# Patient Record
Sex: Male | Born: 1980 | Race: White | Hispanic: No | Marital: Married | Smoking: Former smoker
Health system: Southern US, Community
[De-identification: ages and names within clinical notes are randomized; demographics above are authoritative.]

## PROBLEM LIST (undated history)

## (undated) DIAGNOSIS — I10 Essential (primary) hypertension: Secondary | ICD-10-CM

## (undated) DIAGNOSIS — J45909 Unspecified asthma, uncomplicated: Secondary | ICD-10-CM

## (undated) HISTORY — PX: BACK SURGERY: SHX140

---

## 2017-03-21 ENCOUNTER — Encounter: Payer: Self-pay | Admitting: Emergency Medicine

## 2017-03-21 ENCOUNTER — Ambulatory Visit
Admission: EM | Admit: 2017-03-21 | Discharge: 2017-03-21 | Disposition: A | Discharge status: Home / Self Care | Payer: Managed Care, Other (non HMO) | Attending: Emergency Medicine | Admitting: Emergency Medicine

## 2017-03-21 DIAGNOSIS — H10021 Other mucopurulent conjunctivitis, right eye: Secondary | ICD-10-CM | POA: Diagnosis not present

## 2017-03-21 HISTORY — DX: Unspecified asthma, uncomplicated: J45.909

## 2017-03-21 MED ORDER — FLUORESCEIN SODIUM 0.6 MG OP STRP: 2.0000 | ORAL_STRIP | Freq: Once | OPHTHALMIC | Status: DC

## 2017-03-21 MED ORDER — TETRACAINE HCL 0.5 % OP SOLN: 1.0000 [drp] | Freq: Once | OPHTHALMIC | Status: DC

## 2017-03-21 MED ORDER — MOXIFLOXACIN HCL 0.5 % OP SOLN: 1.0000 [drp] | Freq: Three times a day (TID) | OPHTHALMIC | 0 refills | Status: AC

## 2017-03-21 NOTE — ED Triage Notes (Signed)
Patient c/o redness, tenderness, and drainage from both eyes that started yesterday.

## 2017-03-21 NOTE — ED Provider Notes (Addendum)
[facility]    CSN: [id number] Arrival date & time: 03/21/17  1511     History   Chief Complaint Chief Complaint  Patient presents with  . Eye Problem    HPI Justin Arellano is a 36 y.o. male.   HPI  This a 36 year old male who presents with redness and drainage with matting of his right eye. His had no pain no photo sensitivity. He does not wear contacts. Has had several episodes of bacterial Conjunctivitis as a child and also in adulthood. He does not have any foreign body sensation. He denies any injury to his eye      Past Medical History:  Diagnosis Date  . Asthma     There are no active problems to display for this patient.   Past Surgical History:  Procedure Laterality Date  . BACK SURGERY         Home Medications    Prior to Admission medications   Medication Sig Start Date End Date Taking? Authorizing Provider  albuterol (PROVENTIL HFA;VENTOLIN HFA) 108 (90 Base) MCG/ACT inhaler Inhale 2 puffs into the lungs every 6 (six) hours as needed for wheezing or shortness of breath.   Yes [provider]  moxifloxacin (VIGAMOX) 0.5 % ophthalmic solution Place 1 drop into the right eye 3 (three) times daily. 03/21/17   Lutricia Feil, PA-C    Family History Family History  Problem Relation Age of Onset  . Hypertension Mother   . Hypertension Father     Social History Social History  Substance Use Topics  . Smoking status: Current Every Day Smoker    Types: Cigarettes  . Smokeless tobacco: Never Used  . Alcohol use Yes     Allergies   Penicillins   Review of Systems Review of Systems  Constitutional: Negative for activity change, appetite change, chills, fatigue and fever.  Eyes: Positive for discharge, redness and itching. Negative for photophobia, pain and visual disturbance.  All other systems reviewed and are negative.    Physical Exam Triage Vital Signs ED Triage Vitals [03/21/17 1656]  Enc Vitals Group   BP (!) 148/100     Pulse Rate 68     Resp 16     Temp 98.3 F (36.8 C)     Temp Source Oral     SpO2 100 %     Weight 280 lb (127 kg)     Height  (1.803 m)     Head Circumference      Peak Flow      Pain Score 2     Pain Loc      Pain Edu?      Excl. in GC?    No data found.   Updated Vital Signs BP (!) 157/99 (BP Location: Left Arm)   Pulse 68   Temp 98.3 F (36.8 C) (Oral)   Resp 16   Ht  (1.803 m)   Wt 280 lb (127 kg)   SpO2 100%   BMI 39.05 kg/m   Visual Acuity Right Eye Distance: 20/20 corrected Left Eye Distance: 20/25 corrected Bilateral Distance: 20/25 corrected  Right Eye Near:   Left Eye Near:    Bilateral Near:     Physical Exam  Constitutional: He is oriented to person, place, and time. He appears well-developed and well-nourished. No distress.  HENT:  Head: Normocephalic.  Eyes: Pupils are equal, round, and reactive to light. EOM are normal. Right eye exhibits discharge. Left eye exhibits no discharge.  Induration and has purulent dried drainage from his eye with matting present. The conjunctiva is erythematous. No foreign bodies are seen. Fluorescein was not performed.  Neck: Normal range of motion. Neck supple.  Musculoskeletal: Normal range of motion.  Neurological: He is alert and oriented to person, place, and time.  Skin: Skin is warm and dry. He is not diaphoretic.  Psychiatric: He has a normal mood and affect. His behavior is normal. Judgment and thought content normal.  Nursing note and vitals reviewed.    UC Treatments / Results  Labs (all labs ordered are listed, but only abnormal results are displayed) Labs Reviewed - No data to display  EKG  EKG Interpretation None       Radiology No results found.  Procedures Procedures (including critical care time)  Medications Ordered in UC Medications  tetracaine (PONTOCAINE) 0.5 % ophthalmic solution 1-2 drop (not administered)  fluorescein ophthalmic strip 2 strip  (not administered)     Initial Impression / Assessment and Plan / UC Course  I have reviewed the triage vital signs and the nursing notes.  Pertinent labs & imaging results that were available during my care of the patient were reviewed by me and considered in my medical decision making (see chart for details).     Plan: 1. Test/x-ray results and diagnosis reviewed with patient 2. rx as per orders; risks, benefits, potential side effects reviewed with patient 3. Recommend supportive treatment with cool compresses as necessary for comfort. Use caution around your children so as not to contaminate them. If not improving in 2 days follow-up with  eye-phone number and address were provided to you. 4. F/u prn if symptoms worsen or don't improve   Final Clinical Impressions(s) / UC Diagnoses   Final diagnoses:  Other mucopurulent conjunctivitis of right eye    New Prescriptions Discharge Medication List as of 03/21/2017  5:29 PM    START taking these medications   Details  moxifloxacin (VIGAMOX) 0.5 % ophthalmic solution Place 1 drop into the right eye 3 (three) times daily., Starting Thu 03/21/2017, Normal         Controlled Substance Prescriptions [place] Controlled Substance Registry consulted? Not Applicable   Lutricia FeilRoemer, William P, PA-C 03/21/17 1736    Lutricia Feiloemer, William P, PA-C 03/21/17 1737

## 2019-09-16 ENCOUNTER — Other Ambulatory Visit: Payer: Self-pay | Admitting: Gerontology

## 2019-09-16 DIAGNOSIS — N183 Chronic kidney disease, stage 3 unspecified: Secondary | ICD-10-CM

## 2019-09-24 ENCOUNTER — Ambulatory Visit
Admission: RE | Admit: 2019-09-24 | Discharge: 2019-09-24 | Disposition: A | Discharge status: Home / Self Care | Payer: 59 | Source: Ambulatory Visit | Attending: Gerontology | Admitting: Gerontology

## 2019-09-24 ENCOUNTER — Other Ambulatory Visit: Payer: Self-pay

## 2019-09-24 DIAGNOSIS — N183 Chronic kidney disease, stage 3 unspecified: Secondary | ICD-10-CM | POA: Insufficient documentation

## 2019-10-12 ENCOUNTER — Other Ambulatory Visit: Payer: Self-pay | Admitting: Physical Medicine and Rehabilitation

## 2019-10-12 DIAGNOSIS — M5416 Radiculopathy, lumbar region: Secondary | ICD-10-CM

## 2019-10-18 ENCOUNTER — Ambulatory Visit
Admission: RE | Admit: 2019-10-18 | Discharge: 2019-10-18 | Disposition: A | Discharge status: Home / Self Care | Payer: 59 | Source: Ambulatory Visit | Attending: Physical Medicine and Rehabilitation | Admitting: Physical Medicine and Rehabilitation

## 2019-10-18 DIAGNOSIS — M5416 Radiculopathy, lumbar region: Secondary | ICD-10-CM | POA: Diagnosis not present

## 2021-08-30 ENCOUNTER — Other Ambulatory Visit: Payer: Self-pay | Admitting: Orthopedic Surgery

## 2021-08-30 DIAGNOSIS — S8392XA Sprain of unspecified site of left knee, initial encounter: Secondary | ICD-10-CM

## 2021-09-01 ENCOUNTER — Ambulatory Visit
Admission: RE | Admit: 2021-09-01 | Discharge: 2021-09-01 | Disposition: A | Discharge status: Home / Self Care | Payer: No Typology Code available for payment source | Source: Ambulatory Visit | Attending: Orthopedic Surgery | Admitting: Orthopedic Surgery

## 2021-09-01 DIAGNOSIS — S8392XA Sprain of unspecified site of left knee, initial encounter: Secondary | ICD-10-CM

## 2021-09-21 ENCOUNTER — Other Ambulatory Visit: Payer: Self-pay | Admitting: Orthopedic Surgery

## 2021-09-21 NOTE — Progress Notes (Signed)
Bilateral standing hip to ankle x-rays on one cassette for evaluation of alignment 

## 2022-05-05 IMAGING — MR MR KNEE*L* W/O CM
6 series · 40 of 40 positions shown · non-contrast
Comparison: None.

CLINICAL DATA: Left knee pain and swelling since a fall 3 weeks
ago.

EXAM:
MRI OF THE LEFT KNEE WITHOUT CONTRAST
TECHNIQUE: Multiplanar, multisequence MR imaging of the knee was performed. No
intravenous contrast was administered.

[Series 3: T2 fat-sat · axial · left · 4.0mm · 0.59mm/px · z∈[-60,+80]mm · 7 of 33 slices shown (1 of 3)]
[im 1/33]
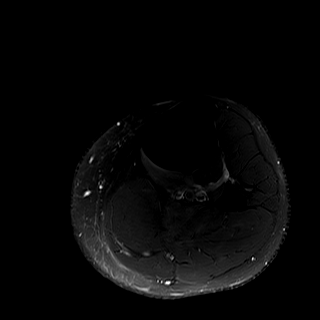
[im 6/33]
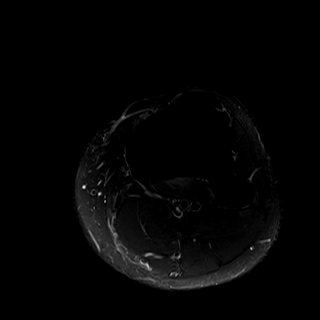
[im 11/33]
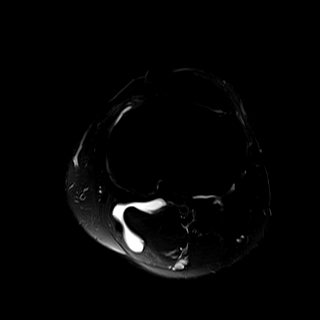
[im 17/33]
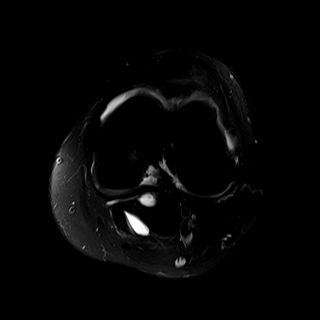
[im 22/33]
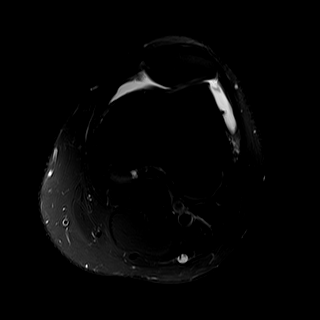
[im 27/33]
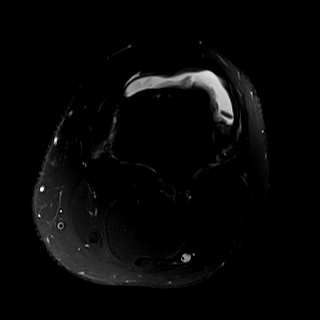
[im 33/33]
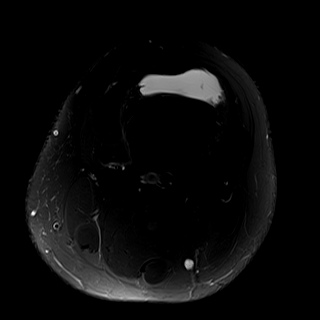

[Series 4: T2 fat-sat · coronal · left · 4.0mm · 0.56mm/px · 6 of 28 slices shown (2 of 3)]
[im 1/28]
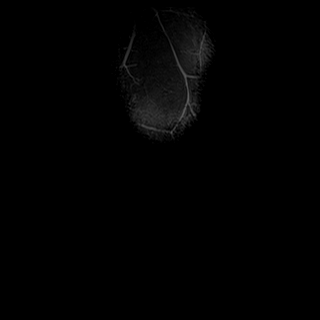
[im 6/28]
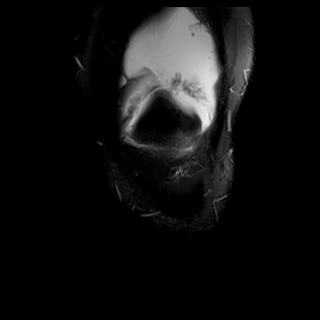
[im 11/28]
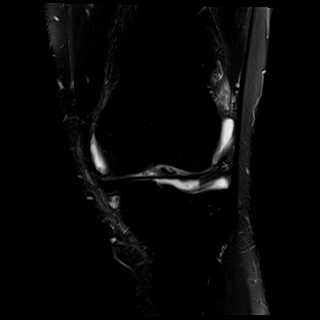
[im 17/28]
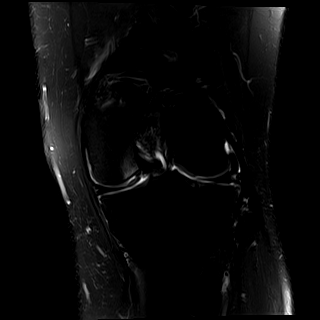
[im 22/28]
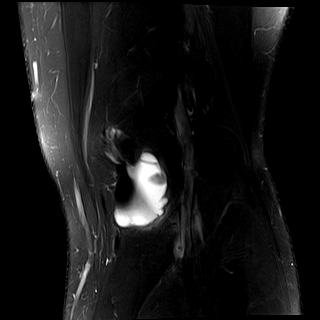
[im 28/28]
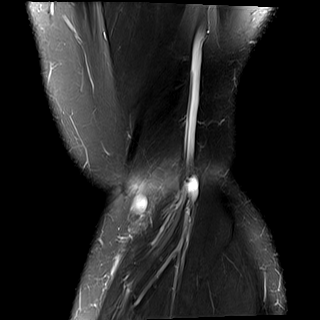

[Series 5: T1 · coronal · left · 4.0mm · 0.56mm/px · 6 of 30 slices shown]
[im 1/30]
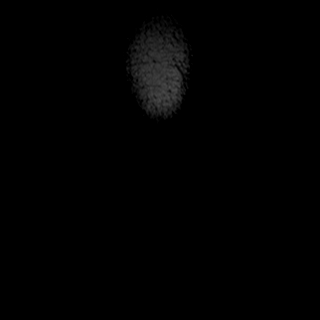
[im 6/30]
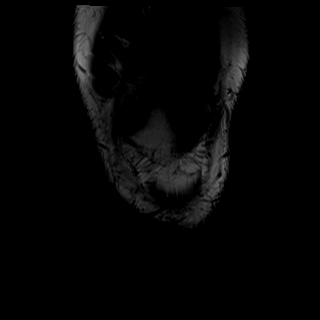
[im 12/30]
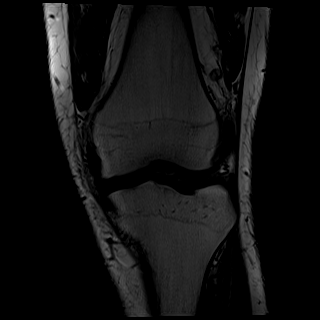
[im 18/30]
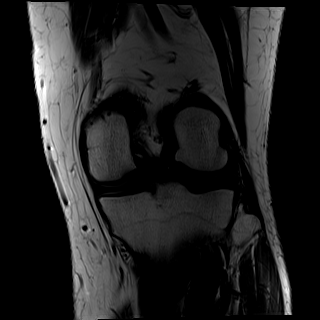
[im 24/30]
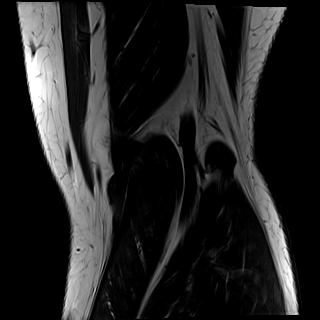
[im 30/30]
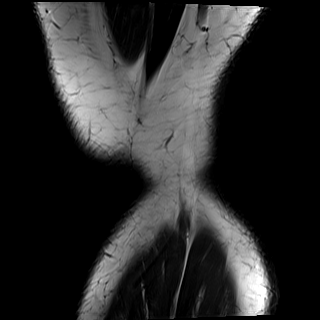

[Series 6: PD fat-sat · coronal · left · 3.0mm · 0.70mm/px · 7 of 34 slices shown (1 of 2)]
[im 1/34]
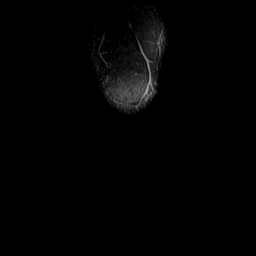
[im 6/34]
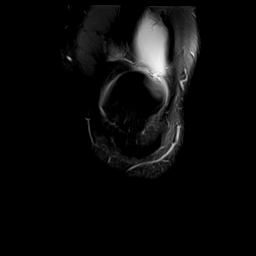
[im 12/34]
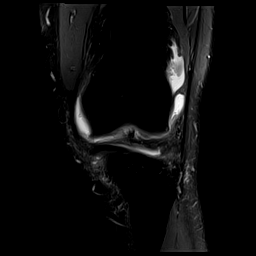
[im 17/34]
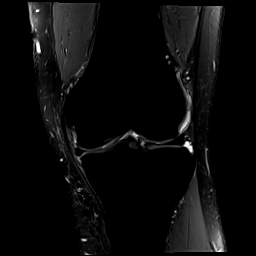
[im 23/34]
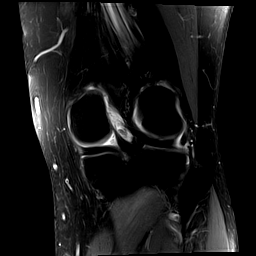
[im 28/34]
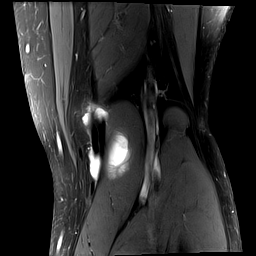
[im 34/34]
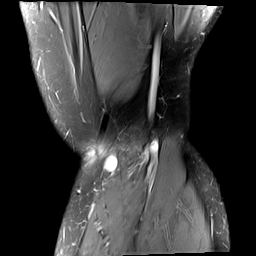

[Series 7: PD fat-sat · sagittal · left · 3.0mm · 0.56mm/px · 7 of 32 slices shown (2 of 2)]
[im 1/32]
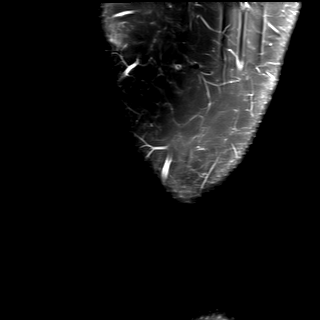
[im 6/32]
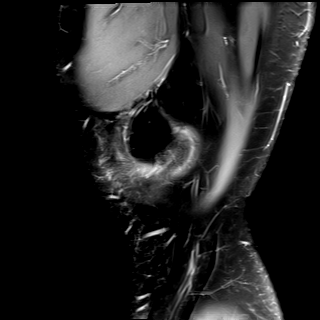
[im 11/32]
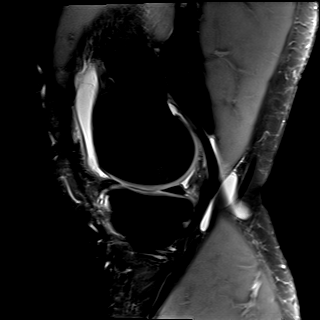
[im 16/32]
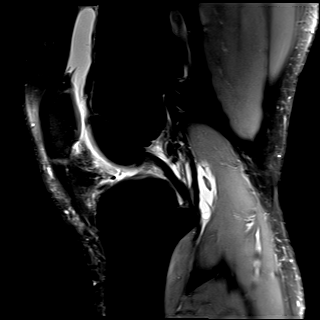
[im 21/32]
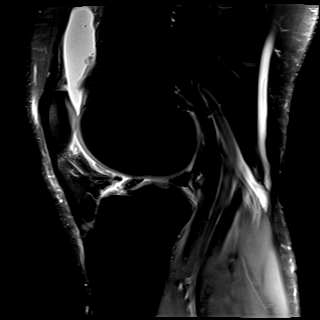
[im 26/32]
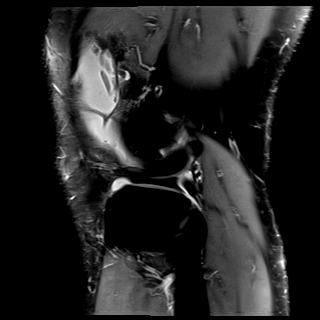
[im 32/32]
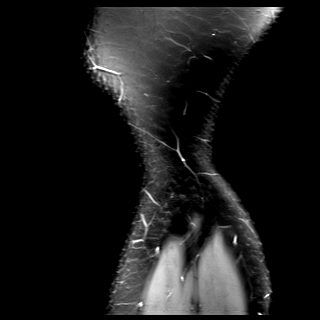

[Series 8: T2 fat-sat · sagittal · left · 3.0mm · 0.56mm/px · 7 of 32 slices shown (3 of 3)]
[im 1/32]
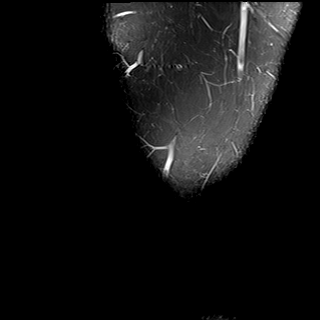
[im 6/32]
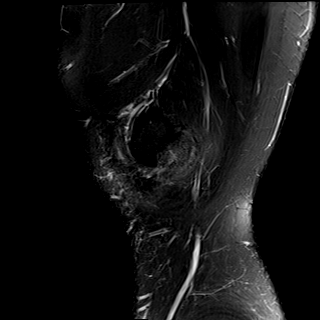
[im 11/32]
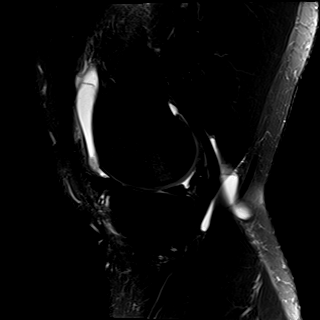
[im 16/32]
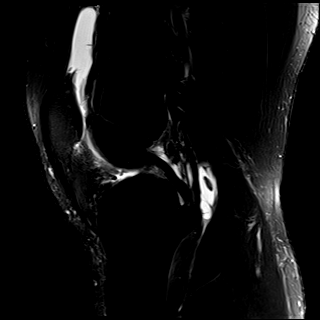
[im 21/32]
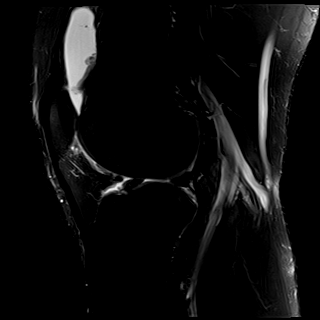
[im 26/32]
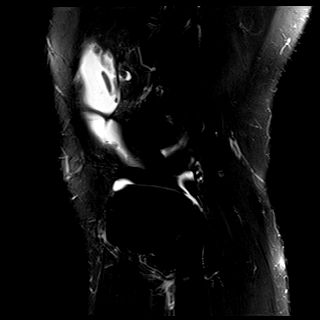
[im 32/32]
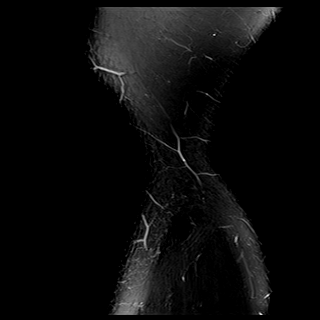

[40 of 40 positions shown; findings below may reference images not displayed]

FINDINGS: MENISCI

Medial meniscus:  Intact.

Lateral meniscus:  Intact.

LIGAMENTS

Cruciates:  Intact.

Collaterals:  Intact.

CARTILAGE

Patellofemoral: Mild cartilage thinning is seen along the inferior
aspect of the lateral femoral trochlea.

Medial: The patient has an osteochondral defect along the posterior
aspect of the medial femoral condyle measuring approximately 0.6 cm
transverse by 0.8 cm AP. Fluid undermines hyaline cartilage and
there is subchondral edema in underlying bone. A linear focus of
intermediate decreased T2 signal measuring 1 cm craniocaudal by
cm transverse is compatible with a loose hyaline cartilage fragment
Baker's cyst.

Lateral:  Mildly degenerated.

Joint: Moderate joint effusion. Along the lateral femoral trochlea,
a focus of intermediate decreased T2 signal measuring 1.4 cm
craniocaudal by 0.8 cm transverse is likely a hyaline cartilage
fragment.

Popliteal Fossa: The patient has a Baker's cyst which cannot be
accurately measured due to its shape but is approximately 3 cm
transverse by up to 3 cm AP by 4.5 cm craniocaudal.

Extensor Mechanism:  Intact.

Bones:  As above.  Otherwise negative.

Other: None.
IMPRESSION: Osteochondral lesion posterior aspect of the medial femoral condyle
with fluid undermining hyaline cartilage worrisome for fragment
instability.

Mild appearing osteoarthritis. Linear foci most compatible with
loose cartilage fragments are identified in a Baker cyst and along
the lateral femoral trochlea.

Negative for meniscal or ligament tear.

## 2022-10-16 ENCOUNTER — Encounter: Payer: Self-pay | Admitting: Emergency Medicine

## 2022-10-16 ENCOUNTER — Ambulatory Visit
Admission: EM | Admit: 2022-10-16 | Discharge: 2022-10-16 | Disposition: A | Discharge status: Home / Self Care | Payer: Commercial Managed Care - PPO | Attending: Physician Assistant | Admitting: Physician Assistant

## 2022-10-16 DIAGNOSIS — J069 Acute upper respiratory infection, unspecified: Secondary | ICD-10-CM

## 2022-10-16 DIAGNOSIS — Z20822 Contact with and (suspected) exposure to covid-19: Secondary | ICD-10-CM

## 2022-10-16 HISTORY — DX: Essential (primary) hypertension: I10

## 2022-10-16 LAB — SARS CORONAVIRUS 2 BY RT PCR: SARS Coronavirus 2 by RT PCR: NEGATIVE

## 2022-10-16 MED ORDER — PROMETHAZINE-DM 6.25-15 MG/5ML PO SYRP: 5.0000 mL | ORAL_SOLUTION | Freq: Four times a day (QID) | ORAL | 0 refills | Status: AC | PRN

## 2022-10-16 NOTE — ED Triage Notes (Signed)
Pt c/o cough, body aches, nasal congestion, runny nose. Started this morning. Wife tested positive for covid yesterday.

## 2022-10-16 NOTE — ED Provider Notes (Signed)
[facility]    CSN: [id number] Arrival date & time: 10/16/22  1036      History   Chief Complaint Chief Complaint  Patient presents with   Cough    HPI Justin Arellano is a 42 y.o. male.   Patient is a 42 year old male who presents with complaint of cough, body aches, nasal congestion, and runny nose that started this morning.  His wife tested positive for COVID yesterday.  His son is in with similar symptoms.  Patient reports his wife symptoms started after being at a meeting at local hospital last week.  Patient states he has had COVID previously without any major complications.  He did not take antivirals at that time.  He does treated symptoms and did his quarantine.  Patient states he has necessary refills on his albuterol inhaler.      Past Medical History:  Diagnosis Date   Asthma    Hypertension     There are no problems to display for this patient.   Past Surgical History:  Procedure Laterality Date   BACK SURGERY         Home Medications    Prior to Admission medications   Medication Sig Start Date End Date Taking? Authorizing Provider  promethazine-dextromethorphan (PROMETHAZINE-DM) 6.25-15 MG/5ML syrup Take 5 mLs by mouth 4 (four) times daily as needed for cough. 10/16/22  Yes Candis Schatz, PA-C  albuterol (PROVENTIL HFA;VENTOLIN HFA) 108 (90 Base) MCG/ACT inhaler Inhale 2 puffs into the lungs every 6 (six) hours as needed for wheezing or shortness of breath.    [provider]  moxifloxacin (VIGAMOX) 0.5 % ophthalmic solution Place 1 drop into the right eye 3 (three) times daily. 03/21/17   Lutricia Feil, PA-C    Family History Family History  Problem Relation Age of Onset   Hypertension Mother    Hypertension Father     Social History Social History   Tobacco Use   Smoking status: Former    Types: Cigarettes   Smokeless tobacco: Never  Vaping Use   Vaping Use: Never used  Substance Use Topics   Alcohol use:  Yes   Drug use: Never     Allergies   Penicillins   Review of Systems Review of Systems as noted above in HPI.  Other systems reviewed and found to be negative   Physical Exam Triage Vital Signs ED Triage Vitals  Enc Vitals Group     BP 10/16/22 1132 (!) 166/110     Pulse Rate 10/16/22 1132 77     Resp 10/16/22 1132 18     Temp 10/16/22 1132 98.5 F (36.9 C)     Temp Source 10/16/22 1132 Oral     SpO2 10/16/22 1132 98 %     Weight 10/16/22 1131 279 lb 15.8 oz (127 kg)     Height 10/16/22 1131 5\' 11"  (1.803 m)     Head Circumference --      Peak Flow --      Pain Score 10/16/22 1131 5     Pain Loc --      Pain Edu? --      Excl. in GC? --    No data found.  Updated Vital Signs BP (!) 171/109 (BP Location: Left Arm)   Pulse 77   Temp 98.5 F (36.9 C) (Oral)   Resp 18   Ht 5\' 11"  (1.803 m)   Wt 279 lb 15.8 oz (127 kg)   SpO2 98%  BMI 39.05 kg/m   Visual Acuity Right Eye Distance:   Left Eye Distance:   Bilateral Distance:    Right Eye Near:   Left Eye Near:    Bilateral Near:     Physical Exam Constitutional:      Appearance: Normal appearance.  HENT:     Nose: Nose normal.  Cardiovascular:     Rate and Rhythm: Normal rate and regular rhythm.     Heart sounds: Normal heart sounds. No murmur heard. Pulmonary:     Effort: Pulmonary effort is normal. No respiratory distress.     Breath sounds: Normal breath sounds. No wheezing or rhonchi.  Neurological:     General: No focal deficit present.     Mental Status: He is alert and oriented to person, place, and time.      UC Treatments / Results  Labs (all labs ordered are listed, but only abnormal results are displayed) Labs Reviewed  SARS CORONAVIRUS 2 BY RT PCR    EKG   Radiology No results found.  Procedures Procedures (including critical care time)  Medications Ordered in UC Medications - No data to display  Initial Impression / Assessment and Plan / UC Course  I have reviewed  the triage vital signs and the nursing notes.  Pertinent labs & imaging results that were available during my care of the patient were reviewed by me and considered in my medical decision making (see chart for details).    Patient presents with upper respiratory viral symptoms that started this morning.  Patient has a history of asthma.  Patient wife tested positive for COVID yesterday but has not been feeling well since the meeting last week.  Son is also presenting with similar symptoms.  Patient has had COVID in the past without any lingering complications.  He did not require any antiviral medications during his last illness.  Give him COVID isolation precautions as well as ER precautions for worsening respiratory status. Final Clinical Impressions(s) / UC Diagnoses   Final diagnoses:  Viral URI with cough  Close exposure to COVID-19 virus     Discharge Instructions      -COVID swab is pending.  Results will be available on MyChart. -Isolate at home for 5 days from start of symptoms and with a full 24 hours of being fever free without ibuprofen or Tylenol -Can use over-the-counter medications for symptom management -Ibuprofen Tylenol as needed for pain -Promethazine cough syrup 4 times a day as needed for cough -Should symptoms worsen, including worsening shortness of breath, report to the ER for further evaluation and management     ED Prescriptions     Medication Sig Dispense Auth. Provider   promethazine-dextromethorphan (PROMETHAZINE-DM) 6.25-15 MG/5ML syrup Take 5 mLs by mouth 4 (four) times daily as needed for cough. 118 mL Candis Schatz, PA-C      PDMP not reviewed this encounter.   Candis Schatz, PA-C 10/16/22 1153

## 2022-10-16 NOTE — Discharge Instructions (Addendum)
-  COVID swab is pending.  Results will be available on MyChart. -Isolate at home for 5 days from start of symptoms and with a full 24 hours of being fever free without ibuprofen or Tylenol -Can use over-the-counter medications for symptom management -Ibuprofen Tylenol as needed for pain -Promethazine cough syrup 4 times a day as needed for cough -Should symptoms worsen, including worsening shortness of breath, report to the ER for further evaluation and management

## 2023-10-19 DIAGNOSIS — Z419 Encounter for procedure for purposes other than remedying health state, unspecified: Secondary | ICD-10-CM | POA: Diagnosis not present

## 2023-11-18 DIAGNOSIS — Z419 Encounter for procedure for purposes other than remedying health state, unspecified: Secondary | ICD-10-CM | POA: Diagnosis not present

## 2023-12-19 DIAGNOSIS — Z419 Encounter for procedure for purposes other than remedying health state, unspecified: Secondary | ICD-10-CM | POA: Diagnosis not present

## 2024-01-18 DIAGNOSIS — Z419 Encounter for procedure for purposes other than remedying health state, unspecified: Secondary | ICD-10-CM | POA: Diagnosis not present

## 2024-02-18 DIAGNOSIS — Z419 Encounter for procedure for purposes other than remedying health state, unspecified: Secondary | ICD-10-CM | POA: Diagnosis not present

## 2024-03-20 DIAGNOSIS — Z419 Encounter for procedure for purposes other than remedying health state, unspecified: Secondary | ICD-10-CM | POA: Diagnosis not present

## 2024-04-21 NOTE — Progress Notes (Signed)
 Subjective:  CC: Chief Complaint  Patient presents with  . Hypertension       Patient ID: Justin Arellano is a 43 y.o. male. This an established patient is here today for an Acute Problem Office Visit.  HPI: Pt is here today out of concern for his BP. He was previously taken off his BP meds due to stability. So, he had not been checking BP at home. Wife took his B/P. Was 160/90 two weeks ago. Wife noticed anytime he got stressed, his face got very red. He's sweating more than normal. Yesterday am, pt started having sinus pain and pressure, frontal sinus congestion. He has been taking Dayquil and Nyquil, likely causing worsening BP elevation. Today, pt has been having a severe headache, blurred vision, generally feeling awful. No chest pain or shortness of breath. BP severely elevated when first coming in to the office. After a few minutes, BP rechecked, had come down quite a bit. Strongly encouraged pt to go to the ED. Pt declined going to the ED. Advised plan of how to bring BP down. (Rx for Clonidine sent to the pharmacy. Pt to take one immediately, then repeat dose in 1-2 hours. Recheck BP in 2 hours. If still elevated above 140/90, give another dose.) Wife is a Radiation Protection Practitioner. She will keep a close eye on pt. She and Pt are not going in to work tomorrow in order to monitor pt. Advised pt/wife low threshold for having pt go to the ED with ANY change in status/ worsening of BP. Wife will call in the morning with updates on Bps.    Review of Systems: Review of Systems  Constitutional:  Negative for chills and fever.  HENT:  Positive for sinus pressure and sinus pain. Negative for ear pain and sore throat.   Eyes:  Positive for visual disturbance. Negative for pain.  Respiratory:  Negative for cough and shortness of breath.   Cardiovascular:  Negative for chest pain and palpitations.  Gastrointestinal:  Negative for abdominal pain and vomiting.  Genitourinary:  Negative for dysuria and hematuria.   Musculoskeletal:  Negative for arthralgias and back pain.  Skin:  Negative for color change and rash.  Neurological:  Positive for light-headedness and headaches. Negative for seizures and syncope.  All other systems reviewed and are negative.   Allergies: Penicillins  Past Medical/Surgical History: Past Medical History:  Diagnosis Date  . Allergy [birth year]   Penicillin  . CKD (chronic kidney disease) stage 3, GFR 30-59 ml/min (CMS-HCC) 05/21/2017   Cr 1.4 with GFR 58 on 05/17/17  . Depression   . Hyperlipidemia, mixed 05/21/2017  . Hypertension, uncontrolled 09/16/2019  . Obesity (BMI 35.0-39.9 without comorbidity), unspecified   . Tobacco use    Past Surgical History:  Procedure Laterality Date  . ENDOSCOPIC LUMBAR DISCECTOMY W/ LASER  2015   2 discectomy @ [facility]   . Upper Dental removal   10/14/2020   TOP TEETH REMOVED  . SPINE SURGERY  12/2013   2 discectomy  . VASECTOMY  02/2018    Social History: Social History   Socioeconomic History  . Marital status: Married  . Number of children: 2  Tobacco Use  . Smoking status: Former    Current packs/day: 0.00    Average packs/day: 1 pack/day for 17.0 years (17.0 ttl pk-yrs)    Types: Cigarettes    Start date: 03/06/2005    Quit date: 03/06/2022    Years since quitting: 2.1    Passive exposure: Never  . Smokeless tobacco:  Former    Types: Snuff  . Tobacco comments:    1-2 cigarettes daily  Vaping Use  . Vaping status: Former  Substance and Sexual Activity  . 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 Drug use: Not Currently    Comment: used to smoke marijuana   . 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 Difficulty of Paying Living Expenses: Not very hard  Food Insecurity: No Food Insecurity (09/24/2019)   Hunger Vital Sign   . Worried About Programme Researcher, Broadcasting/film/video in the Last Year: Never true   . Ran Out of Food in the Last Year: Never true  Transportation Needs: No Transportation Needs (09/24/2019)   PRAPARE - Transportation   . Lack of Transportation (Medical): No   . Lack of Transportation (Non-Medical): No  Social Connections: Unknown (09/24/2019)   Social Connection and Isolation Panel   . Frequency of Communication with Friends and Family: Twice a week   . Frequency of Social Gatherings with Friends and Family: Twice a week   . Attends Religious Services: Not asked   . Marital Status: Married  Housing Stability: Unknown (12/15/2023)   Housing Stability Vital Sign   . Homeless in the Last Year: No    Family History: Family History  Problem Relation Name Age of Onset  . Irritable bowel syndrome Mother Nena   . Anesthesia problems Mother Nena   . Stroke Father Georganna   . Depression Father Georganna   . Alcohol abuse Father Georganna   . Breast cancer Maternal Grandmother Hargis   . Skin cancer Maternal Grandfather Jerrell Marina   . No Known Problems Paternal Grandmother    . No Known Problems Paternal Grandfather      Immunizations: Immunization History  Administered Date(s) Administered  . COVID-19 Moderna Vaccine (1st,2nd,3rd dose = 0.53ml) 01/03/2020, 02/01/2020  . Influenza IIV4, IM PF (6 mo+) (FLULAVAL/FLUZONE/FLUARIX QUAD) 05/17/2017  . PNEUMOCOCCAL (PPSV23)(>=30YRS -OR- >=2 YRS WITH RISK) VACCINE (PNEUMOVAX 23) 05/17/2017  . TDAP (>=49YR) VACCINE (ADACEL/BOOSTRIX) 07/26/2017    Depression Screening: Depression assessment: Patient underlying factors that can contribute to depression: none  PHQ 2/9 last 3 flowsheet values    04/23/2022     1:27 PM 10/24/2022   10:17 AM 04/21/2024    4:12 PM  PHQ-2/9 Depression Screening   Little interest or pleasure in doing things  0 3  Feeling down, depressed, or hopeless  0 3  Patient Health Questionnaire-2 Score  0 * 6  Trouble falling or staying asleep, or sleeping too much   3  Feeling tired or having little energy   3  Poor appetite or overeating   0  Feeling bad about yourself - or that you are a failure or have let yourself or your family down   3  Trouble concentrating on things, such as reading the newspaper or watching television   0  Moving or speaking so slowly that other people could have noticed? Or the opposite -  being so fidgety or restless that you have been moving around a lot more than usual.   1  Thoughts that you would be better off dead or hurting yourself in some way   0  Patient Health Questionnaire-9 Score   16  (OBSOLETE) Little interest or pleasure in doing things 0    (OBSOLETE) Feeling down, depressed, or hopeless (or irritable for Teens only)? 0    (OBSOLETE) Total Prescreening Score 0    (OBSOLETE) Total Score = 0      * Data saved with a previous flowsheet row definition      Is the depression screen above positive? (Score >9) No, the score was 0-4  Time Spent: 0-4 min      Goals:  Goals     . Eat more fruits and vegetables     Patient would like to eat a more healthy diet.         Current Medications: No outpatient medications have been marked as taking for the 04/21/24 encounter (Office Visit) with Steva Clotilda Conn, NP.    Medication list above reconciled and reviewed for current and on-going appropriateness using patient's verbal report of what he is taking.      Objective:   Vitals:   04/21/24 1653  BP: (!) 190/110  Pulse:    Ht:177.8 cm (5' 10) Wt:(!) 132.7 kg (292 lb 9.6 oz) AFP:Anib mass index is 41.98 kg/m.   Physical Exam Vitals and nursing note reviewed.  Constitutional:      General: He is not in acute  distress.    Appearance: Normal appearance. He is well-developed. He is not diaphoretic.  HENT:     Head: Normocephalic.  Eyes:     Pupils: Pupils are equal, round, and reactive to light.  Neck:     Vascular: No JVD.  Cardiovascular:     Rate and Rhythm: Normal rate and regular rhythm.     Heart sounds: Normal heart sounds.  Pulmonary:     Effort: Pulmonary effort is normal.     Breath sounds: Normal breath sounds.  Musculoskeletal:     Cervical back: Normal range of motion.  Skin:    General: Skin is warm and dry.  Neurological:     General: No focal deficit present.     Mental Status: He is alert and oriented to person, place, and time. Mental status is at baseline.  Psychiatric:        Mood and Affect: Mood normal.        Behavior: Behavior normal. Behavior is cooperative.        Thought Content: Thought content normal.        Judgment: Judgment normal.         No visits with results within 3 Month(s) from this visit.  Latest known visit with results is:  Urgent Care Visit on 12/15/2023  Component Date Value Ref Range Status  . POC Coronavirus (COVID-19) SARS-Co* 12/15/2023 Not Detected  Not Detected Final     Assessment and Plan:   Diagnoses and all orders for this visit:  Hypertension, uncontrolled -     cloNIDine HCL (CATAPRES) 0.1 MG tablet; Take 1 tablet (0.1 mg total) by mouth 2 (two) times daily  - Initially: take 1 tablet immediately, repeat dose in about an hour. Recheck BP in 2 hours. Give third pill if still elevated >140/90   - Call office in the am with BP updates for further instructions. - Low threshold for going to the  ED- with ANY change in condition or persistently elevated pressures.   Depression screening (Z13.31) -     Depression Screen -(PHQ- 2/9, BDI)    No follow-ups on file., sooner as needed.    Current Medical Providers and Suppliers:   [facility] Patient Care Team: Steva Clotilda Conn, NP as PCP - General (Family  Medicine) Future Appointments   This patient does not currently have any appointments scheduled.      Attestation Statement:   I personally performed the service, non-incident to. (WP)   SHANNON HIATT COWARD, NP  An after visit summary with all of these plans was provided for the patient either in written format or through MyChart.       Clotilda H. Coward, AGNP-C    *Some images could not be shown.

## 2024-05-20 DIAGNOSIS — Z419 Encounter for procedure for purposes other than remedying health state, unspecified: Secondary | ICD-10-CM | POA: Diagnosis not present

## 2024-05-30 NOTE — Progress Notes (Signed)
 Called to the front for Dizziness. Patient was noted to be alert, airway patent, respirations even and unlabored, skin tone appropriate. . Patient prioritized for intake or further evaluation. Vital signs obtained. . Patient educated to report any changes in condition to front desk staff. SABRA

## 2024-05-31 NOTE — Progress Notes (Signed)
 CHIEF COMPLAINT:  Justin Arellano is a 43 y.o. male who presents to urgent care with the following concerns: fuzzy headed feeling (Not dizziness, per patient, just a sense of brain fog able to ambulate with even stable gait, face symmetrical, clear speech. Recently started clonidine, first dose this am. Sx onset 1 hour ago ), blood pressure concern (Concerned about bp, amlodipine ordered, has not started. BP 118/80 this am at home. ), and Shortness of Breath (1 hour ago, able to speak in full sentences without difficulty. Well appearing. )   SUBJECTIVE/HPI:   History of Present Illness Justin Arellano is a 43 year old male with hypertension who presents with brain fog sensation and bradycardia.   He has been experiencing dizziness described as brain fog sensation, and a slight sensation of uncoordination, since this morning. The dizziness is a general sensation without specific triggers.  No room spinning sensation, presyncope, unstable gait.   Approximately a month ago, his blood pressure was recorded at 190/140 mmHg, leading to several adjustments in his medication regimen over the past couple weeks with primary care. He was initially on a clonidine patch, which was recently switched to oral clonidine. Today marks the first day of taking oral clonidine, and he is supposed to take 0.3 mg but only took 0.1 mg. He has not yet restarted amlodipine. He also takes hydralazine three times a day, valsartan in the morning, fluoxetine for stress, and propranolol first thing in the morning. The propranolol has been part of his regimen for anxiety for some time, while valsartan was added more recently.   He endorses sensation of shallow breathing, not quite SOB, but instead requiring conscious effort to breathe while at rest. In the past 24 to 48 hours, he has experienced diarrhea.   No recent head injuries, chest pain, blurred vision, headaches, palpitations, or changes in urination.   I reviewed the  patient's PMH, problem list, medications, allergies, and social history.  Past Medical History:  has a past medical history of Allergy ([birth year]), CKD (chronic kidney disease) stage 3, GFR 30-59 ml/min (CMS-HCC) (05/21/2017), Depression, Hyperlipidemia, mixed (05/21/2017), Hypertension, uncontrolled (09/16/2019), Obesity (BMI 35.0-39.9 without comorbidity), unspecified, and Tobacco use. Prior to encounter Medications:  Current Outpatient Medications on File Prior to Visit  Medication Sig Dispense Refill  . cloNIDine HCL (CATAPRES) 0.1 MG tablet Take 1 tablet (0.1 mg total) by mouth 2 (two) times daily as needed (elevated BP and also for sleep as needed) 90 tablet 11  . cloNIDine HCL (CATAPRES) 0.3 MG tablet Take 1 tablet (0.3 mg total) by mouth 2 (two) times daily 180 tablet 3  . FLUoxetine (PROZAC) 10 MG capsule Take 1 capsule (10 mg total) by mouth once daily 30 capsule 11  . hydrALAZINE (APRESOLINE) 50 MG tablet Take 1 tablet (50 mg total) by mouth 3 (three) times daily 90 tablet 11  . propranoloL (INDERAL) 10 MG tablet Take 1 tablet (10 mg total) by mouth 4 (four) times daily as needed (anxiety, stress) 90 tablet 11  . valsartan (DIOVAN) 320 MG tablet Take 1 tablet (320 mg total) by mouth once daily 90 tablet 3  . albuterol MDI, PROVENTIL, VENTOLIN, PROAIR, HFA 90 mcg/actuation inhaler Inhale 2 inhalations into the lungs every 4 (four) hours as needed for Wheezing for up to 30 days 1 each 0  . amLODIPine (NORVASC) 5 MG tablet Take 1 tablet (5 mg total) by mouth at bedtime (Patient not taking: Reported on 05/30/2024) 90 tablet 3  . cholecalciferol (VITAMIN D3) 2,000  unit capsule Take 3 capsules daily for 3 months, then reduce to 1 capsule daily thereafter for Vitamin D Deficiency. (Patient not taking: Reported on 10/24/2022) 360 capsule 11  . valsartan (DIOVAN) 80 MG tablet  (Patient not taking: Reported on 05/30/2024)     No current facility-administered medications on file prior to visit.    Allergies: is allergic to penicillins.  ROS:  Review of Systems As noted in HPI OBJECTIVE:   Vitals:   05/30/24 1416  BP: 111/68  Pulse: 54  Resp: 16  Temp: 37.1 C (98.7 F)  SpO2: 99%  Weight: (!) 133.8 kg (294 lb 15.6 oz)   Vital signs and nursing note reviewed.  Physical Exam Constitutional:      Appearance: Normal appearance. He is not ill-appearing or diaphoretic.  Eyes:     General: No visual field deficit. Cardiovascular:     Rate and Rhythm: Regular rhythm. Bradycardia present.  Pulmonary:     Effort: Pulmonary effort is normal.     Breath sounds: Normal breath sounds.  Skin:    General: Skin is warm and dry.     Coloration: Skin is not jaundiced.     Findings: No rash.  Neurological:     General: No focal deficit present.     Mental Status: He is alert and oriented to person, place, and time.     Cranial Nerves: Cranial nerves 2-12 are intact. No cranial nerve deficit, dysarthria or facial asymmetry.     Motor: No weakness.     Coordination: Coordination is intact.     Gait: Gait normal.  Psychiatric:        Mood and Affect: Mood normal.        Behavior: Behavior normal.     Reviewed previous notes and labs in chart review    LABS/X-RAYS/EKG/MEDS:   Results for orders placed or performed in visit on 05/30/24  Complete Blood Count (CBC) with Differential  Result Value Ref Range   WBC (White Blood Cell Count) 9.2 3.2 - 9.8 x10^9/L   Hemoglobin 13.5 (L) 13.7 - 17.3 g/dL   Hematocrit 59.6 60.9 - 49.0 %   Platelets 240 150 - 450 x10^9/L   MCV (Mean Corpuscular Volume) 83 80 - 98 fL   MCH (Mean Corpuscular Hemoglobin) 27.7 26.5 - 34.0 pg   MCHC (Mean Corpuscular Hemoglobin Concentration) 33.5 31.5 - 36.3 %   RBC (Red Blood Cell Count) 4.87 4.37 - 5.74 x10^12/L   RDW-CV (Red Cell Distribution Width) 13.4 11.5 - 14.5 %   MPV (Mean Platelet Volume) 11.1 7.2 - 11.7 fL   Neutrophil Count 5.2 2.0 - 8.6 x10^9/L   Neutrophil % 56.4 37 - 80 %    Lymphocyte Count 3.0 0.6 - 4.2 x10^9/L   Lymphocyte % 32.4 10 - 50 %   Monocyte Count 0.7 0 - 0.9 x10^9/L   Monocyte % 7.8 0 - 12 %   Eosinophil Count 0.24 0 - 0.70 x10^9/L   Eosinophil % 2.6 0 - 7 %   Basophil Count 0.06 0 - 0.20 x10^9/L   Basophil % 0.7 0 - 2 %   Immature Granulocyte Count 0.01 <=0.06 x10^9/L   Immature Granulocyte % 0.1 <=0.7 %  Thyroid Profile (TSH and T4 Free)  Result Value Ref Range   Thyroid Stimulating Hormone (TSH) 5.10 0.34 - 5.66 IU/mL   Thyroxine, Free (FT4) 0.99 0.52 - 1.21 ng/dL  [facility] POC Epoc Chem8  Result Value Ref Range   Sodium, Whole Blood 141 135 -  145 mmol/L   Potassium. Whole Blood 3.8 3.5 - 5.0 mmol/L   Chloride, Whole Blood 108 98 - 108 mmol/L   Total CO2, Whole Blood 26 21 - 30 mmol/L   Urea Nitrogen, Whole Blood 14 7 - 20 mg/dL   Creatinine, Whole Blood 1.18 0.60 - 1.30 mg/dL   Glucose, Whole Blood 88 70 - 140 mg/dL   Calcium, Ionized, Whole Blood 1.21 1.15 - 1.33 mmol/L   Narrative   POC TEST(S) ABOVE PERFORMED AT THE PATIENT CARE LOCATION AND OVERSEEN BY THE [facility] POCT PROGRAM.   [facility] POC Urinalysis Chemical w/Option to Reflex Urine Culture  Result Value Ref Range   See Comment      This urinalysis did not meet the criteria for reflex to Culture, Urine.   Color Amber (!) Colorless, Straw, Yellow, Light Yellow, Dark Yellow   Clarity Clear Clear   Specific Gravity 1.020 1.005 - 1.030   pH, Urine 6.0 5.0 - 8.0   Protein, Urinalysis 1+ (!) Negative   Glucose, Urinalysis Negative Negative   Ketones, Urinalysis Trace (!) Negative   Blood, Urinalysis Negative Negative   Nitrite, Urinalysis Negative Negative   Leukocytes, Urinalysis Negative Negative   Bilirubin, Urinalysis Negative Negative   Urobilinogen, Urinalysis 1.0 0.2 - 1.0 mg/dL   Narrative   POC TEST(S) ABOVE PERFORMED AT THE PATIENT CARE LOCATION AND OVERSEEN BY THE [facility] POCT PROGRAM.   ECG 12-lead  Result Value Ref Range   Vent Rate (bpm) 52    PR Interval (msec) 148     QRS Interval (msec) 98    QT Interval (msec) 438    QTc (msec) 407    Narrative   Sinus bradycardia Otherwise normal ECG  When compared with ECG of 16-Oct-2021 14:35, Sinus bradycardia has replaced Normal sinus rhythm I reviewed and concur with this report. Electronically signed ab:[email or web address], MD, NEIL ([id number]) on 05/30/2024 9:06:44 PM    EKG - sinus bradycardia with no acute ischemic changes, arrhythmia, or block - my read, pending final.   No results found.  ASSESSMENT/PLAN:     ICD-10-CM  1. Bradycardia  R00.1    Assessment & Plan Dizziness and bradycardia due to antihypertensive polypharmacy Dizziness (described as brain fog and feeling off) and bradycardia. Heart rate is low at 54 bpm, with a history of mid-60s. Other VSS including BP 111/68 on intake. PE above with overall well appearing patient. EKG - sinus bradycardia with no acute ischemic changes, arrhythmia, or block - my read. Final above agrees.  CBC and EPOC WNL, no findings of anemia or electrolyte abnormality. UA with no significant dehydration or UTI. Thyroid panel sent. Reassuringly no chest pain, SOB, n/v, blurred vision, headaches, or neuro changes. Do not suspect acute cardiopulmonary process or neurologic emergency. Nonspecific dizziness coincides with bradycardia which is likely secondary to hypertensive polypharmacy, especially given recent medication changes including new oral clonidine this AM. Recommend holding tonight's propanolol and hydroxyzine (PRN), and continuing to monitor vitals at home. Wife is here today and is medically trained, and they have BP/HR monitoring devices at home. Plan to FU with PCP on Monday for further mgmt. RTC/ED precautions given, if any worsening in meanwhile.    Requested Prescriptions    No prescriptions requested or ordered in this encounter    Juline Balboa, PA-C 05/31/2024  Future Appointments   This patient does not currently have any appointments scheduled.     This  note has been created using automated tools and reviewed for accuracy by dino,  Jasmyn Mertes, PA-C. Verbal consent to utilize [employer] obtained.  This note was partially made with the aid of speech-to-text dictation; typographical errors are not intentional.

## 2024-06-03 ENCOUNTER — Other Ambulatory Visit: Payer: Self-pay | Admitting: Gerontology

## 2024-06-03 DIAGNOSIS — I10 Essential (primary) hypertension: Secondary | ICD-10-CM

## 2024-06-10 ENCOUNTER — Ambulatory Visit
Admission: RE | Admit: 2024-06-10 | Discharge: 2024-06-10 | Disposition: A | Discharge status: Home / Self Care | Source: Ambulatory Visit | Attending: Gerontology | Admitting: Gerontology

## 2024-06-10 DIAGNOSIS — I10 Essential (primary) hypertension: Secondary | ICD-10-CM | POA: Insufficient documentation

## 2024-06-11 LAB — ECHOCARDIOGRAM COMPLETE
AR max vel: 4.29 cm2
AV Area VTI: 4.18 cm2
AV Area mean vel: 4.08 cm2
AV Mean grad: 4 mmHg
AV Peak grad: 8.2 mmHg
Ao pk vel: 1.43 m/s
Area-P 1/2: 3.17 cm2
Calc EF: 51.3 %
MV VTI: 4.16 cm2
S' Lateral: 2 cm
Single Plane A2C EF: 60.7 %
Single Plane A4C EF: 47.1 %
